# Patient Record
Sex: Female | Born: 1970 | Race: Black or African American | Hispanic: No | Marital: Married | Smoking: Former smoker
Health system: Southern US, Community
[De-identification: ages and names within clinical notes are randomized; demographics above are authoritative.]

## PROBLEM LIST (undated history)

## (undated) DIAGNOSIS — E079 Disorder of thyroid, unspecified: Secondary | ICD-10-CM

## (undated) DIAGNOSIS — I10 Essential (primary) hypertension: Secondary | ICD-10-CM

---

## 2005-01-04 ENCOUNTER — Ambulatory Visit: Payer: Self-pay | Admitting: Family Medicine

## 2005-02-21 ENCOUNTER — Ambulatory Visit: Payer: Self-pay | Admitting: Family Medicine

## 2005-09-20 ENCOUNTER — Emergency Department: Payer: Self-pay | Admitting: Emergency Medicine

## 2007-02-26 ENCOUNTER — Ambulatory Visit: Payer: Self-pay

## 2007-06-02 ENCOUNTER — Emergency Department: Payer: Self-pay | Admitting: Emergency Medicine

## 2007-06-06 ENCOUNTER — Ambulatory Visit: Payer: Self-pay | Admitting: Unknown Physician Specialty

## 2007-07-01 ENCOUNTER — Emergency Department: Payer: Self-pay | Admitting: Emergency Medicine

## 2007-07-02 ENCOUNTER — Other Ambulatory Visit: Payer: Self-pay

## 2007-07-02 ENCOUNTER — Inpatient Hospital Stay: Payer: Self-pay | Admitting: Unknown Physician Specialty

## 2011-01-06 ENCOUNTER — Emergency Department: Payer: Self-pay | Admitting: Emergency Medicine

## 2011-01-12 ENCOUNTER — Other Ambulatory Visit: Payer: Self-pay | Admitting: Internal Medicine

## 2011-01-18 ENCOUNTER — Ambulatory Visit: Payer: Self-pay | Admitting: Internal Medicine

## 2013-08-16 ENCOUNTER — Emergency Department: Payer: Self-pay | Admitting: Emergency Medicine

## 2013-08-17 ENCOUNTER — Emergency Department: Payer: Self-pay | Admitting: Emergency Medicine

## 2013-08-22 ENCOUNTER — Emergency Department: Payer: Self-pay | Admitting: Internal Medicine

## 2015-06-08 ENCOUNTER — Encounter: Payer: Self-pay | Admitting: Obstetrics and Gynecology

## 2017-02-25 ENCOUNTER — Encounter: Payer: Self-pay | Admitting: Emergency Medicine

## 2017-02-25 ENCOUNTER — Emergency Department
Admission: EM | Admit: 2017-02-25 | Discharge: 2017-02-25 | Disposition: A | Discharge status: Home / Self Care | Payer: Self-pay | Attending: Emergency Medicine | Admitting: Emergency Medicine

## 2017-02-25 ENCOUNTER — Emergency Department: Payer: Self-pay

## 2017-02-25 DIAGNOSIS — Z79899 Other long term (current) drug therapy: Secondary | ICD-10-CM | POA: Insufficient documentation

## 2017-02-25 DIAGNOSIS — R000 Tachycardia, unspecified: Secondary | ICD-10-CM

## 2017-02-25 DIAGNOSIS — I10 Essential (primary) hypertension: Secondary | ICD-10-CM | POA: Insufficient documentation

## 2017-02-25 DIAGNOSIS — E0590 Thyrotoxicosis, unspecified without thyrotoxic crisis or storm: Secondary | ICD-10-CM | POA: Insufficient documentation

## 2017-02-25 DIAGNOSIS — Z87891 Personal history of nicotine dependence: Secondary | ICD-10-CM | POA: Insufficient documentation

## 2017-02-25 HISTORY — DX: Essential (primary) hypertension: I10

## 2017-02-25 LAB — DIFFERENTIAL
BASOS ABS: 0 10*3/uL (ref 0–0.1)
BASOS PCT: 1 %
EOS ABS: 0.3 10*3/uL (ref 0–0.7)
EOS PCT: 6 %
LYMPHS ABS: 2.3 10*3/uL (ref 1.0–3.6)
Lymphocytes Relative: 47 %
Monocytes Absolute: 0.7 10*3/uL (ref 0.2–0.9)
Monocytes Relative: 15 %
NEUTROS PCT: 31 %
Neutro Abs: 1.5 10*3/uL (ref 1.4–6.5)

## 2017-02-25 LAB — BASIC METABOLIC PANEL
Anion gap: 5 (ref 5–15)
BUN: 15 mg/dL (ref 6–20)
CALCIUM: 10 mg/dL (ref 8.9–10.3)
CHLORIDE: 108 mmol/L (ref 101–111)
CO2: 27 mmol/L (ref 22–32)
CREATININE: 0.45 mg/dL (ref 0.44–1.00)
Glucose, Bld: 115 mg/dL — ABNORMAL HIGH (ref 65–99)
Potassium: 3.7 mmol/L (ref 3.5–5.1)
SODIUM: 140 mmol/L (ref 135–145)

## 2017-02-25 LAB — HEPATIC FUNCTION PANEL
ALBUMIN: 3.6 g/dL (ref 3.5–5.0)
ALK PHOS: 49 U/L (ref 38–126)
ALT: 38 U/L (ref 14–54)
AST: 33 U/L (ref 15–41)
BILIRUBIN TOTAL: 0.6 mg/dL (ref 0.3–1.2)
Total Protein: 6.3 g/dL — ABNORMAL LOW (ref 6.5–8.1)

## 2017-02-25 LAB — CBC
HCT: 35.2 % (ref 35.0–47.0)
Hemoglobin: 11.8 g/dL — ABNORMAL LOW (ref 12.0–16.0)
MCH: 27.9 pg (ref 26.0–34.0)
MCHC: 33.6 g/dL (ref 32.0–36.0)
MCV: 82.9 fL (ref 80.0–100.0)
PLATELETS: 315 10*3/uL (ref 150–440)
RBC: 4.25 MIL/uL (ref 3.80–5.20)
RDW: 12.5 % (ref 11.5–14.5)
WBC: 4.7 10*3/uL (ref 3.6–11.0)

## 2017-02-25 LAB — TROPONIN I

## 2017-02-25 LAB — T4, FREE: FREE T4: 4.16 ng/dL — AB (ref 0.61–1.12)

## 2017-02-25 LAB — POCT PREGNANCY, URINE: Preg Test, Ur: NEGATIVE

## 2017-02-25 LAB — TSH: TSH: <0.01 u[IU]/mL — ABNORMAL LOW (ref 0.350–4.500)

## 2017-02-25 LAB — FIBRIN DERIVATIVES D-DIMER (ARMC ONLY): FIBRIN DERIVATIVES D-DIMER (ARMC): 493.87 (ref 0.00–499.00)

## 2017-02-25 MED ORDER — METOPROLOL TARTRATE 25 MG PO TABS
25.0000 mg | ORAL_TABLET | Freq: Once | ORAL | Status: AC
Start: 2017-02-25 — End: 2017-02-25
  Administered 2017-02-25: 25 mg via ORAL
  Filled 2017-02-25: qty 1

## 2017-02-25 MED ORDER — METHIMAZOLE 5 MG PO TABS
15.0000 mg | ORAL_TABLET | Freq: Once | ORAL | Status: AC
  Administered 2017-02-25: 15 mg via ORAL
  Filled 2017-02-25: qty 1

## 2017-02-25 MED ORDER — SODIUM CHLORIDE 0.9 % IV SOLN
Freq: Once | INTRAVENOUS | Status: AC
Start: 2017-02-25 — End: 2017-02-25
  Administered 2017-02-25: 999 mL via INTRAVENOUS

## 2017-02-25 MED ORDER — METHIMAZOLE 5 MG PO TABS: 15.0000 mg | ORAL_TABLET | Freq: Every day | ORAL | 0 refills | Status: AC

## 2017-02-25 NOTE — ED Triage Notes (Signed)
Pt reports recent hx of tachycardia, states she has been followed by her PCP and cardiology and have adjusted her metoprolol dosage. Pt wore monitor at the end of April as well. Pt denies any chest pain. Hx of HTN. Pt states she has been having a lot of phlegm throughout the day and is worse at night.

## 2017-02-25 NOTE — ED Notes (Signed)
ED Provider at bedside. 

## 2017-02-25 NOTE — ED Notes (Signed)
Patient transported to X-ray 

## 2017-02-25 NOTE — Discharge Instructions (Signed)
You were evaluated for racing heart beat and found to have elevated thyroid, hyperthyroidism.  You are being started on methimazole, 15 mg daily. Increase your metoprolol ER to 3 tablets or 75 mg once per day.  Please follow up with primary care doctor this week for further workup and investigation for the underlying cause of the elevated thyroid state.  Return to the emergency department immediately for any worsening symptoms including chest pain, trouble breathing, heart rate greater than 150, fever, dizziness, passing out, vision changes, leg swelling, or any other symptoms concerning to you.

## 2017-02-25 NOTE — ED Provider Notes (Signed)
[facility] Emergency Department Provider Note ____________________________________________   I have reviewed the triage vital signs and the triage nursing note.  HISTORY  Chief Complaint Tachycardia   Historian Patient  HPI Anne Nielsen is a 46 y.o. female with a history of hypertension and some anxiety, states that she started noticing elevated heart rate about a week and half ago. She saw Dr. Vennie Homans who she last saw him 2005 for these symptoms and states that she was started on metoprolol 25 mg daily in addition to her home blood pressure medication which is lisinopril and was also recommended to start oral passion flower for anxiety.  She does state that she drinks a fair amount of caffeine in the form of coffee, however last week she didn't drink coffee trying to seek out help and it did not.  She is reporting some sinus drainage and phlegm in the mornings, which might be related to allergies. She's had some mild occasional shortness of breath without any pleuritic chest pain. Reports occasional feet swelling, but no calf or leg pain or swelling, no period of travel or immobilization.    Past Medical History:  Diagnosis Date  . Hypertension     There are no active problems to display for this patient.   History reviewed. No pertinent surgical history.  Prior to Admission medications   Medication Sig Start Date End Date Taking? Authorizing Provider  lisinopril (PRINIVIL,ZESTRIL) 20 MG tablet Take 20 mg by mouth daily.   Yes [provider]  metoprolol succinate (TOPROL-XL) 25 MG 24 hr tablet Take 25 mg by mouth 2 (two) times daily. 02/23/17 02/23/18 Yes [provider]  methimazole (TAPAZOLE) 5 MG tablet Take 3 tablets (15 mg total) by mouth daily. 02/25/17   Governor Rooks, MD    No Known Allergies  History reviewed. No pertinent family history.  Social History Social History  Substance Use Topics  . Smoking status: Former  Smoker    Types: Cigars  . Smokeless tobacco: Never Used  . Alcohol use No    Review of Systems  Constitutional: Negative for fever. Eyes: Negative for visual changes. ENT: Negative for sore throat. Cardiovascular: Negative for chest pain.  Positive for heart racing/palpitations Respiratory: Negative for cough, occasional shortness of breath. Gastrointestinal: Negative for abdominal pain, vomiting and diarrhea. Genitourinary: Negative for dysuria. Musculoskeletal: Negative for back pain. Skin: Negative for rash. Neurological: Negative for headache. 10 point Review of Systems otherwise negative ____________________________________________   PHYSICAL EXAM:  VITAL SIGNS: ED Triage Vitals  Enc Vitals Group     BP 02/25/17 0845 (!) 147/81     Pulse Rate 02/25/17 0845 (!) 128     Resp 02/25/17 0845 20     Temp 02/25/17 0845 98.6 F (37 C)     Temp Source 02/25/17 0845 Oral     SpO2 02/25/17 0845 99 %     Weight 02/25/17 0846 188 lb (85.3 kg)     Height 02/25/17 0846 5\' 2"  (1.575 m)     Head Circumference --      Peak Flow --      Pain Score --      Pain Loc --      Pain Edu? --      Excl. in GC? --      Constitutional: Alert and oriented. Well appearing and in no distress. HEENT   Head: Normocephalic and atraumatic.      Eyes: Conjunctivae are normal. PERRL. Normal extraocular movements.  Ears:         Nose: No congestion/rhinnorhea.   Mouth/Throat: Mucous membranes are moist.   Neck: No stridor. Cardiovascular/Chest:Tachycardic, regular rhythm.  No murmurs, rubs, or gallops. Respiratory: Normal respiratory effort without tachypnea nor retractions. Breath sounds are clear and equal bilaterally. No wheezes/rales/rhonchi. Gastrointestinal: Soft. No distention, no guarding, no rebound. Nontender.   Genitourinary/rectal:Deferred Musculoskeletal: Nontender with normal range of motion in all extremities. No joint effusions.  No lower extremity tenderness.  No  edema. Neurologic:  Normal speech and language. No gross or focal neurologic deficits are appreciated. Skin:  Skin is warm, dry and intact. No rash noted. Psychiatric: Mood and affect are normal. Speech and behavior are normal. Patient exhibits appropriate insight and judgment.   ____________________________________________  LABS (pertinent positives/negatives)  Labs Reviewed  BASIC METABOLIC PANEL - Abnormal; Notable for the following:       Result Value   Glucose, Bld 115 (*)    All other components within normal limits  CBC - Abnormal; Notable for the following:    Hemoglobin 11.8 (*)    All other components within normal limits  TSH - Abnormal; Notable for the following:    TSH <0.010 (*)    All other components within normal limits  T4, FREE - Abnormal; Notable for the following:    Free T4 4.16 (*)    All other components within normal limits  HEPATIC FUNCTION PANEL - Abnormal; Notable for the following:    Total Protein 6.3 (*)    Bilirubin, Direct <0.1 (*)    All other components within normal limits  TROPONIN I  FIBRIN DERIVATIVES D-DIMER ([facility] ONLY)  DIFFERENTIAL  POCT PREGNANCY, URINE    ____________________________________________    EKG I, Governor Rooksebecca Daelyn Mozer, MD, the attending physician have personally viewed and interpreted all ECGs.  123 bpm sinus bradycardia. Narrow QRS. Normal axis. Normal ST and T-wave. Left atrial enlargement. ____________________________________________  RADIOLOGY All Xrays were viewed by me. Imaging interpreted by Radiologist.  CXR two view: No active cardiopulmonary disease. __________________________________________  PROCEDURES  Procedure(s) performed: None  Critical Care performed: None  ____________________________________________   ED COURSE / ASSESSMENT AND PLAN  Pertinent labs & imaging results that were available during my care of the patient were reviewed by me and considered in my medical decision making (see chart  for details).     Of sinus tachycardia reveals hyperthyroidism. No obvious goiter. No neck pain. No clear indicators for thyroiditis versus Graves' disease versus nodular disease. I [facility] with [facility] who recommended outpatient workup, but initiate methimazole and increase the beta blocker today.  No evidence of thyroid storm.  We discussed symptoms of thyroid storm/return precautions.    CONSULTATIONS:   Dr. Jerry CarasAl Mushref, [facility] - recommends starting Methimazole 15 mg once daily, increasing metoprolol and outpatient follow-up with PCP and endocrinologist.   Patient / Family / Caregiver informed of clinical course, medical decision-making process, and agree with plan.   I discussed return precautions, follow-up instructions, and discharge instructions with patient and/or family.  Discharge instructions: You were evaluated for racing heart beat and found to have elevated thyroid, hyperthyroidism.  You are being started on methimazole, 15 mg daily. Increase your metoprolol ER to 3 tablets or 75 mg once per day.  Please follow up with primary care doctor this week for further workup and investigation for the underlying cause of the elevated thyroid state.  Return to the emergency department immediately for any worsening symptoms including chest pain, trouble breathing,  heart rate greater than 150, fever, dizziness, passing out, vision changes, leg swelling, or any other symptoms concerning to you.  ___________________________________________   FINAL CLINICAL IMPRESSION(S) / ED DIAGNOSES   Final diagnoses:  Hyperthyroidism  Tachycardia              Note: This dictation was prepared with Dragon dictation. Any transcriptional errors that result from this process are unintentional    Governor Rooks, MD 02/25/17 [phone]

## 2017-03-01 DIAGNOSIS — E0590 Thyrotoxicosis, unspecified without thyrotoxic crisis or storm: Secondary | ICD-10-CM | POA: Insufficient documentation

## 2017-06-01 ENCOUNTER — Other Ambulatory Visit: Payer: Self-pay | Admitting: Student

## 2017-06-01 DIAGNOSIS — Z1231 Encounter for screening mammogram for malignant neoplasm of breast: Secondary | ICD-10-CM

## 2017-09-05 ENCOUNTER — Other Ambulatory Visit: Payer: Self-pay | Admitting: Internal Medicine

## 2017-09-05 DIAGNOSIS — E0590 Thyrotoxicosis, unspecified without thyrotoxic crisis or storm: Secondary | ICD-10-CM

## 2017-10-04 ENCOUNTER — Other Ambulatory Visit
Admission: RE | Admit: 2017-10-04 | Discharge: 2017-10-04 | Disposition: A | Discharge status: Home / Self Care | Payer: BLUE CROSS/BLUE SHIELD | Source: Ambulatory Visit | Attending: Internal Medicine | Admitting: Internal Medicine

## 2017-10-04 ENCOUNTER — Encounter
Admission: RE | Admit: 2017-10-04 | Discharge: 2017-10-04 | Disposition: A | Discharge status: Home / Self Care | Payer: BLUE CROSS/BLUE SHIELD | Source: Ambulatory Visit | Attending: Internal Medicine | Admitting: Internal Medicine

## 2017-10-04 ENCOUNTER — Other Ambulatory Visit
Admission: RE | Admit: 2017-10-04 | Payer: BLUE CROSS/BLUE SHIELD | Source: Ambulatory Visit | Admitting: Internal Medicine

## 2017-10-04 DIAGNOSIS — E0590 Thyrotoxicosis, unspecified without thyrotoxic crisis or storm: Secondary | ICD-10-CM | POA: Insufficient documentation

## 2017-10-04 LAB — PREGNANCY, URINE: PREG TEST UR: NEGATIVE

## 2017-10-04 MED ORDER — SODIUM IODIDE I-123 7.4 MBQ CAPS
200.0000 | ORAL_CAPSULE | Freq: Once | ORAL | Status: AC
  Administered 2017-10-04: 200 via ORAL

## 2017-10-05 ENCOUNTER — Encounter
Admission: RE | Admit: 2017-10-05 | Discharge: 2017-10-05 | Disposition: A | Discharge status: Home / Self Care | Payer: BLUE CROSS/BLUE SHIELD | Source: Ambulatory Visit | Attending: Internal Medicine | Admitting: Internal Medicine

## 2017-10-12 ENCOUNTER — Encounter
Admission: RE | Admit: 2017-10-12 | Discharge: 2017-10-12 | Disposition: A | Discharge status: Home / Self Care | Payer: BLUE CROSS/BLUE SHIELD | Source: Ambulatory Visit | Attending: Internal Medicine | Admitting: Internal Medicine

## 2017-10-12 DIAGNOSIS — E0590 Thyrotoxicosis, unspecified without thyrotoxic crisis or storm: Secondary | ICD-10-CM

## 2017-10-12 MED ORDER — SODIUM IODIDE I 131 CAPSULE
11.6000 | Freq: Once | INTRAVENOUS | Status: AC | PRN
Start: 2017-10-12 — End: 2017-10-12
  Administered 2017-10-12: 11.6 via ORAL

## 2018-07-28 IMAGING — NM NM THYROID IMAGING W/ UPTAKE MULTI (4&24 HR)
1 series · 4 of 4 positions shown · non-contrast
Comparison: None.

CLINICAL DATA: Hyperthyroidism.  Serum TSH less than 0.01.

EXAM:
THYROID SCAN AND UPTAKE - 4 AND 24 HOURS
TECHNIQUE: Following oral administration of I 123 capsule, anterior planar
imaging was acquired at 24 hours. Thyroid uptake was calculated with
a thyroid probe at 4-6 hours and 24 hours.
RADIOPHARMACEUTICALS:  146.9 uCi I -123

[Series 1000: (id) thyroid scan · 2.40mm/px · 4 of 4 slices shown]
[im 1/4  full-range]
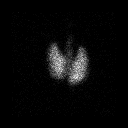
[im 2/4  full-range]
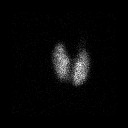
[im 3/4  full-range]
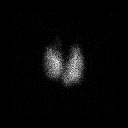
[im 4/4]
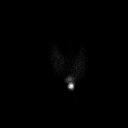

[4 of 4 positions shown; findings below may reference images not displayed]

FINDINGS: Thyroid imaging demonstrates homogeneous thyroid activity
bilaterally. No focal hot or cold nodules are demonstrated.

4 hour I 123 uptake = 53.5% (normal 5-20%)

24 hour I 123 uptake = 80.2% (normal 10-30%)
IMPRESSION: Elevated thyroid uptake with homogeneous activity on imaging
consistent with diffuse toxic goiter (Graves disease).

## 2021-09-03 ENCOUNTER — Emergency Department
Admission: EM | Admit: 2021-09-03 | Discharge: 2021-09-04 | Disposition: A | Discharge status: Other Acute Inpatient Hospital | Payer: BLUE CROSS/BLUE SHIELD | Attending: Emergency Medicine | Admitting: Emergency Medicine

## 2021-09-03 ENCOUNTER — Other Ambulatory Visit: Payer: Self-pay

## 2021-09-03 DIAGNOSIS — Z23 Encounter for immunization: Secondary | ICD-10-CM | POA: Insufficient documentation

## 2021-09-03 DIAGNOSIS — Z87891 Personal history of nicotine dependence: Secondary | ICD-10-CM | POA: Insufficient documentation

## 2021-09-03 DIAGNOSIS — I10 Essential (primary) hypertension: Secondary | ICD-10-CM | POA: Insufficient documentation

## 2021-09-03 DIAGNOSIS — T23262A Burn of second degree of back of left hand, initial encounter: Secondary | ICD-10-CM | POA: Insufficient documentation

## 2021-09-03 DIAGNOSIS — Y93G3 Activity, cooking and baking: Secondary | ICD-10-CM | POA: Insufficient documentation

## 2021-09-03 DIAGNOSIS — T300 Burn of unspecified body region, unspecified degree: Secondary | ICD-10-CM

## 2021-09-03 DIAGNOSIS — Z20822 Contact with and (suspected) exposure to covid-19: Secondary | ICD-10-CM | POA: Insufficient documentation

## 2021-09-03 DIAGNOSIS — X102XXA Contact with fats and cooking oils, initial encounter: Secondary | ICD-10-CM | POA: Insufficient documentation

## 2021-09-03 DIAGNOSIS — T23261A Burn of second degree of back of right hand, initial encounter: Secondary | ICD-10-CM | POA: Insufficient documentation

## 2021-09-03 DIAGNOSIS — Z79899 Other long term (current) drug therapy: Secondary | ICD-10-CM | POA: Insufficient documentation

## 2021-09-03 LAB — CBC WITH DIFFERENTIAL/PLATELET
Abs Immature Granulocytes: 0.04 10*3/uL (ref 0.00–0.07)
Basophils Absolute: 0.1 10*3/uL (ref 0.0–0.1)
Basophils Relative: 1 %
Eosinophils Absolute: 0.1 10*3/uL (ref 0.0–0.5)
Eosinophils Relative: 1 %
HCT: 41 % (ref 36.0–46.0)
Hemoglobin: 13.3 g/dL (ref 12.0–15.0)
Immature Granulocytes: 0 %
Lymphocytes Relative: 32 %
Lymphs Abs: 3 10*3/uL (ref 0.7–4.0)
MCH: 29.7 pg (ref 26.0–34.0)
MCHC: 32.4 g/dL (ref 30.0–36.0)
MCV: 91.5 fL (ref 80.0–100.0)
Monocytes Absolute: 0.5 10*3/uL (ref 0.1–1.0)
Monocytes Relative: 6 %
Neutro Abs: 5.7 10*3/uL (ref 1.7–7.7)
Neutrophils Relative %: 60 %
Platelets: 329 10*3/uL (ref 150–400)
RBC: 4.48 MIL/uL (ref 3.87–5.11)
RDW: 13.2 % (ref 11.5–15.5)
WBC: 9.4 10*3/uL (ref 4.0–10.5)
nRBC: 0 % (ref 0.0–0.2)

## 2021-09-03 LAB — BRAIN NATRIURETIC PEPTIDE: B Natriuretic Peptide: 10.1 pg/mL (ref 0.0–100.0)

## 2021-09-03 MED ORDER — ONDANSETRON HCL 4 MG/2ML IJ SOLN
4.0000 mg | Freq: Once | INTRAMUSCULAR | Status: AC
  Administered 2021-09-03: 4 mg via INTRAVENOUS
  Filled 2021-09-03: qty 2

## 2021-09-03 MED ORDER — MORPHINE SULFATE (PF) 4 MG/ML IV SOLN
4.0000 mg | Freq: Once | INTRAVENOUS | Status: AC
  Administered 2021-09-03: 4 mg via INTRAVENOUS
  Filled 2021-09-03: qty 1

## 2021-09-03 MED ORDER — TETANUS-DIPHTH-ACELL PERTUSSIS 5-2.5-18.5 LF-MCG/0.5 IM SUSY
0.5000 mL | PREFILLED_SYRINGE | Freq: Once | INTRAMUSCULAR | Status: AC
  Administered 2021-09-04: 0.5 mL via INTRAMUSCULAR
  Filled 2021-09-03: qty 0.5
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
## 2021-09-03 NOTE — ED Notes (Signed)
Lavender and light green tubes resent to lab
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
## 2021-09-03 NOTE — ED Notes (Signed)
Lab called x 2 regarding blood work needed for patient. Per lab need blood tubes for patient.
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
## 2021-09-04 DIAGNOSIS — E038 Other specified hypothyroidism: Secondary | ICD-10-CM | POA: Insufficient documentation

## 2021-09-04 DIAGNOSIS — I10 Essential (primary) hypertension: Secondary | ICD-10-CM | POA: Insufficient documentation

## 2021-09-04 DIAGNOSIS — T310 Burns involving less than 10% of body surface: Secondary | ICD-10-CM | POA: Insufficient documentation

## 2021-09-04 LAB — RESP PANEL BY RT-PCR (FLU A&B, COVID) ARPGX2
Influenza A by PCR: NEGATIVE
Influenza B by PCR: NEGATIVE
SARS Coronavirus 2 by RT PCR: NEGATIVE
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
## 2021-09-06 DIAGNOSIS — T23299A Burn of second degree of multiple sites of unspecified wrist and hand, initial encounter: Secondary | ICD-10-CM | POA: Insufficient documentation

## 2021-12-27 ENCOUNTER — Ambulatory Visit (INDEPENDENT_AMBULATORY_CARE_PROVIDER_SITE_OTHER): Payer: BC Managed Care – PPO

## 2021-12-27 ENCOUNTER — Ambulatory Visit
Admission: EM | Admit: 2021-12-27 | Discharge: 2021-12-27 | Disposition: A | Discharge status: Home / Self Care | Payer: BC Managed Care – PPO | Attending: Emergency Medicine | Admitting: Emergency Medicine

## 2021-12-27 ENCOUNTER — Other Ambulatory Visit: Payer: Self-pay

## 2021-12-27 DIAGNOSIS — M869 Osteomyelitis, unspecified: Secondary | ICD-10-CM | POA: Insufficient documentation

## 2021-12-27 DIAGNOSIS — R109 Unspecified abdominal pain: Secondary | ICD-10-CM | POA: Diagnosis not present

## 2021-12-27 HISTORY — DX: Disorder of thyroid, unspecified: E07.9

## 2021-12-27 LAB — URINALYSIS, ROUTINE W REFLEX MICROSCOPIC
Bilirubin Urine: NEGATIVE
Glucose, UA: NEGATIVE mg/dL
Ketones, ur: NEGATIVE mg/dL
Leukocytes,Ua: NEGATIVE
Nitrite: NEGATIVE
Protein, ur: NEGATIVE mg/dL
Specific Gravity, Urine: 1.01 (ref 1.005–1.030)
pH: 6 (ref 5.0–8.0)

## 2021-12-27 LAB — URINALYSIS, MICROSCOPIC (REFLEX)

## 2021-12-27 LAB — PREGNANCY, URINE: Preg Test, Ur: NEGATIVE

## 2021-12-27 MED ORDER — IBUPROFEN 600 MG PO TABS: 600.0000 mg | ORAL_TABLET | Freq: Four times a day (QID) | ORAL | 0 refills | Status: AC | PRN
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
## 2021-12-27 NOTE — Discharge Instructions (Addendum)
Take the ibuprofen every 6 hours with food for treatment of your pain. ? ?I have made referral to physical therapy and the clinic will reach out to you to schedule an appointment. ? ?I would also schedule appointment to follow-up with your GYN at [facility] clinic. ? ?Return for reevaluation for new or worsening symptoms. ?

## 2021-12-27 NOTE — ED Triage Notes (Signed)
Patient is here for "Abd Pain". Started "last week with cramping, like gas at first". Some concerns with "constipation". This am "difficult stool". More abd pain "when bending over". No nausea with pain or vomiting. No dysuria.  ?

## 2022-01-06 ENCOUNTER — Ambulatory Visit: Payer: BC Managed Care – PPO | Admitting: Physical Therapy

## 2022-09-27 ENCOUNTER — Other Ambulatory Visit: Payer: Self-pay | Admitting: Family Medicine

## 2022-09-27 DIAGNOSIS — Z1231 Encounter for screening mammogram for malignant neoplasm of breast: Secondary | ICD-10-CM

## 2022-10-19 IMAGING — CR DG ABDOMEN 1V
2 series · 2 of 2 positions shown · non-contrast
Comparison: None.

CLINICAL DATA: Abdominal pain

EXAM:
ABDOMEN - 1 VIEW

[abdomen kub (1 of 2)]
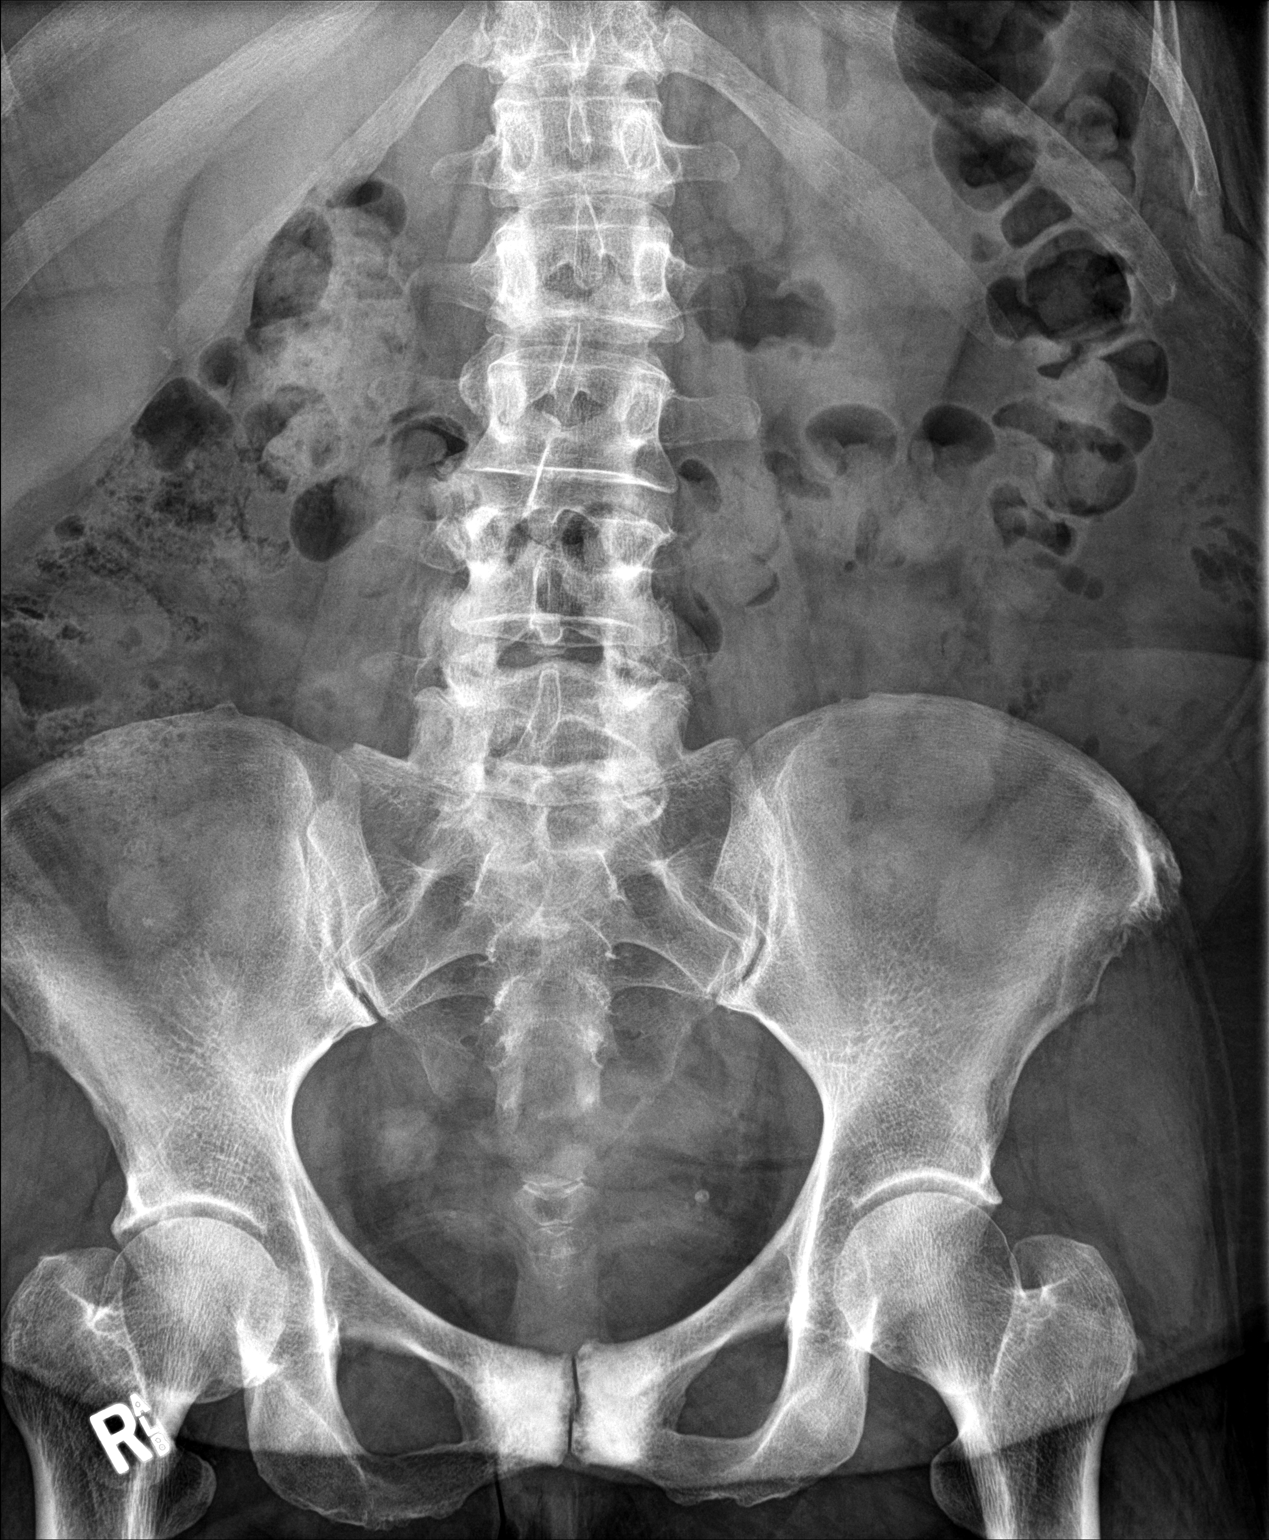

[abdomen kub (2 of 2)]
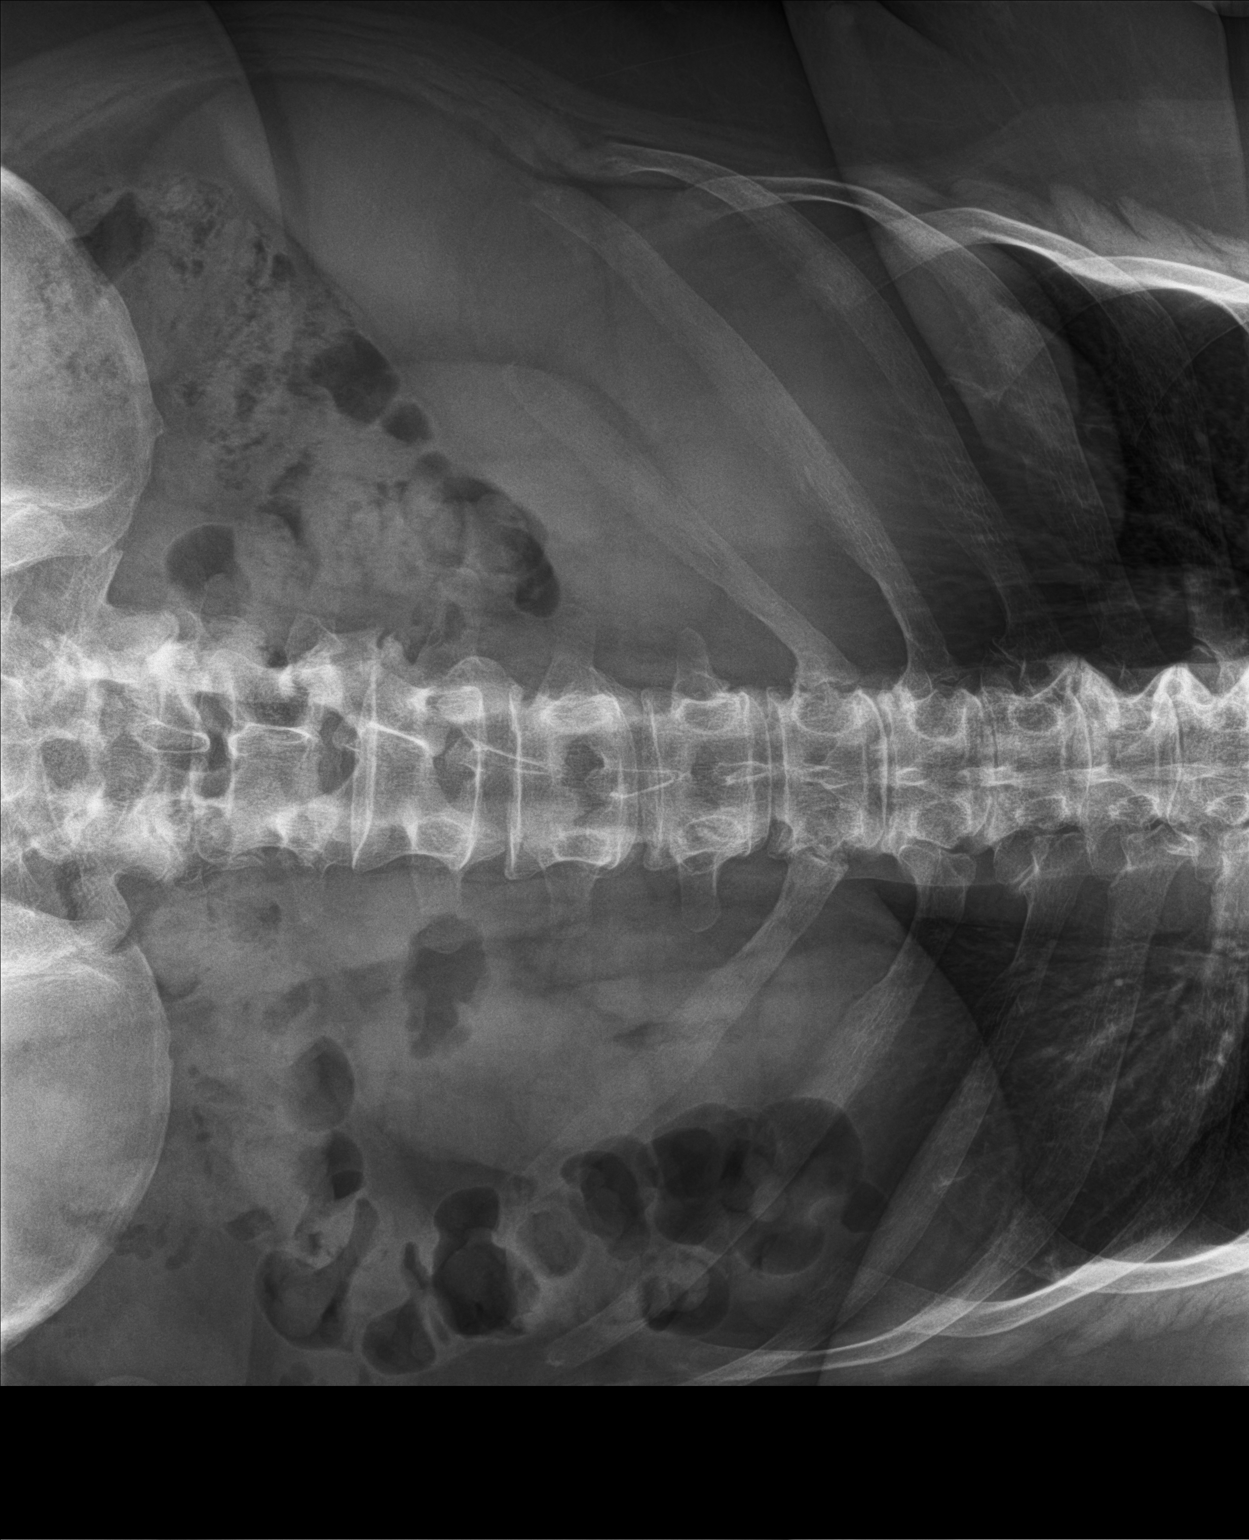

[2 of 2 positions shown; findings below may reference images not displayed]

FINDINGS: The bowel gas pattern is normal. No radio-opaque calculi or other
significant radiographic abnormality are seen. Prominent sclerosis
of the parasymphyseal pubic bones. No pelvic diastasis.
IMPRESSION: 1. Nonobstructive bowel gas pattern.
2. Prominent sclerosis of the parasymphyseal pubic bones, likely
reflecting osteitis pubis.

## 2024-03-31 ENCOUNTER — Other Ambulatory Visit: Payer: Self-pay | Admitting: Family Medicine

## 2024-03-31 DIAGNOSIS — Z1231 Encounter for screening mammogram for malignant neoplasm of breast: Secondary | ICD-10-CM
